# Patient Record
Sex: Female | Born: 1955 | Race: Black or African American | Hispanic: No | Marital: Single | Smoking: Never smoker
Health system: Southern US, Community
[De-identification: ages and names within clinical notes are randomized; demographics above are authoritative.]

## PROBLEM LIST (undated history)

## (undated) DIAGNOSIS — K5730 Diverticulosis of large intestine without perforation or abscess without bleeding: Secondary | ICD-10-CM

## (undated) DIAGNOSIS — D126 Benign neoplasm of colon, unspecified: Secondary | ICD-10-CM

## (undated) HISTORY — DX: Benign neoplasm of colon, unspecified: D12.6

## (undated) HISTORY — PX: APPENDECTOMY: SHX54

## (undated) HISTORY — DX: Diverticulosis of large intestine without perforation or abscess without bleeding: K57.30

---

## 2007-08-08 DIAGNOSIS — K5730 Diverticulosis of large intestine without perforation or abscess without bleeding: Secondary | ICD-10-CM

## 2007-08-08 HISTORY — DX: Diverticulosis of large intestine without perforation or abscess without bleeding: K57.30

## 2007-08-19 ENCOUNTER — Ambulatory Visit: Payer: Self-pay | Admitting: Gastroenterology

## 2007-08-28 ENCOUNTER — Encounter: Payer: Self-pay | Admitting: Gastroenterology

## 2007-08-28 ENCOUNTER — Ambulatory Visit (HOSPITAL_COMMUNITY): Admission: RE | Admit: 2007-08-28 | Discharge: 2007-08-28 | Payer: Self-pay | Admitting: Gastroenterology

## 2007-08-28 ENCOUNTER — Ambulatory Visit: Payer: Self-pay | Admitting: Gastroenterology

## 2007-08-28 DIAGNOSIS — D126 Benign neoplasm of colon, unspecified: Secondary | ICD-10-CM

## 2007-08-28 HISTORY — PX: COLONOSCOPY: SHX174

## 2007-08-28 HISTORY — DX: Benign neoplasm of colon, unspecified: D12.6

## 2010-05-08 ENCOUNTER — Emergency Department (HOSPITAL_COMMUNITY)
Admission: EM | Admit: 2010-05-08 | Discharge: 2010-05-08 | Disposition: A | Discharge status: Home / Self Care | Payer: Self-pay | Attending: Emergency Medicine | Admitting: Emergency Medicine

## 2010-05-08 DIAGNOSIS — L299 Pruritus, unspecified: Secondary | ICD-10-CM | POA: Insufficient documentation

## 2010-05-08 DIAGNOSIS — L255 Unspecified contact dermatitis due to plants, except food: Secondary | ICD-10-CM | POA: Insufficient documentation

## 2010-05-22 NOTE — Op Note (Signed)
NAMELILLER, Wanda            ACCOUNT NO.:  [id number]   MEDICAL RECORD NO.:  [id number]          PATIENT TYPE:  AMB   LOCATION:  DAY                           FACILITY:  [facility]   PHYSICIAN:  Kassie Mends, M.D.      DATE OF BIRTH:  05-22-1955   DATE OF PROCEDURE:  08/28/2007  DATE OF DISCHARGE:                               OPERATIVE REPORT   REFERRING PHYSICIAN:  [facility] Department.   PROCEDURE:  Colonoscopy with snare cautery polypectomy.   INDICATIONS FOR EXAM:  Ms. Wanda Heath is a 55 year old female who  presents with rectal bleeding, intermittent.   FINDINGS:  1. A 1.2-cm sessile ascending colon polyp removed via snare cautery.      A 6-mm flat sigmoid colon polyp removed via snare cautery.  2. Rare sigmoid colon diverticula.  3. Moderate internal hemorrhoids.  Otherwise, normal retroflexed view      of the rectum and no masses, inflammatory changes, or AVM seen.   DIAGNOSES:  1. Large ascending colon polyp.  2. Small sigmoid colon polyp.  3. Mild diverticulosis.  4. Moderate internal hemorrhoids.   RECOMMENDATIONS:  1. Screening colonoscopy in 5 years.  Her first-degree relatives      should have a screening colonoscopy at age 56 and then every 5      years.  2. We will call her with results of her biopsies.  3. She should follow a high-fiber diet.  She is given a handout on      high-fiber diet, polyps, and hemorrhoids.  4. No aspirin, NSAIDs, or anticoagulation for 7 days.   MEDICATIONS:  1. Demerol 100 mg IV.  2. Versed 5 mg IV.   PROCEDURE TECHNIQUE:  Physical exam was performed.  Informed consent was  obtained from the patient after explaining the benefits, risks, and  alternatives to the procedure.  The patient was connected to the monitor  and placed in left lateral position.  Continuous oxygen was provided by  nasal cannula and IV medicine administered through an indwelling  cannula.  After administration of sedation and rectal exam,  the  patient's rectum was intubated and scope advanced under direct  visualization to the cecum.  The patient has slightly tortuous sigmoid  colon.  The  scope was removed slowly by carefully examining the color, texture,  anatomy, and integrity of the mucosa on the way out.  The patient was  recovered in endoscopy and discharged home in satisfactory condition.   PATH:  Tubulovillous adenoma. TCS in 5 years.      Kassie Mends, M.D.  Electronically Signed     SM/MEDQ  D:  08/28/2007  T:  08/29/2007  Job:  [id number]   cc:   [facility] Department

## 2010-05-22 NOTE — Consult Note (Signed)
NAMEAMETHYST, Wanda Heath            ACCOUNT NO.:  [id number]   MEDICAL RECORD NO.:  [id number]          PATIENT TYPE:  AMB   LOCATION:  DAY                           FACILITY:  [facility]   PHYSICIAN:  Kassie Mends, M.D.      DATE OF BIRTH:  01/04/1956   DATE OF CONSULTATION:  08/19/2007  DATE OF DISCHARGE:                                 CONSULTATION   REASON FOR CONSULTATION:  Rectal bleeding.   HISTORY OF PRESENT ILLNESS:  Wanda Heath is a 55 year old female who  was diagnosed with hemorrhoids 19 years ago when she had her last child.  She has been seeing bleeding in her stool off and on for 4 months.  The  first time she used the bathroom and she saw blood in her stool for 1-2  days, and then it stopped.  Then a month ago, she had a bowel movement,  saw blood in her stool, and it lasted 3-4 days.  She did not see pain in  her rectum.  She does have occasional rectal itching.  She was given  rectal suppositories but that was after the bleeding already stopped.  She denies any weight loss or change in her bowel habits.  She  occasionally takes Maxitrate for constipation.  She did take Maxitrate  prior to the second episode of rectal bleeding.  She denies having to  strain to have a bowel movement.  Sometimes, she has hard stools,  sometimes it is normal.  She easily has a bowel movement every other day  or every day.  She denies any nausea or vomiting.  She has no problem  swallowing.  Some foods cause bloating.  She eats ice cream every night.  Her weight has increased from 163 pounds to 195 pounds since the last  year.   PAST MEDICAL HISTORY:  None.   PAST SURGICAL HISTORY:  Appendectomy.  She has never had any problems  with sedation.   ALLERGIES:  No known drug allergies.   MEDICATIONS:  None.   FAMILY HISTORY:  She denies any family history of breast, uterine,  ovarian or colon cancer.  She has no family history of polyp.   SOCIAL HISTORY:  She is single and she has 2  kids.  Kids are 35 and 19.  She is unemployed.  She has never smoked, and occasionally drinks  alcohol.   REVIEW OF SYSTEMS:  As per the HPI.  Otherwise, all systems are  negative.   PHYSICAL EXAMINATION:  VITAL SIGNS:  Weight 195 pounds, height 5 feet 2  inches, BMI 35.7 (obese), temperature 98.7, blood pressure 128/88, and  pulse 80.  GENERAL:  She is in no apparent distress.  Alert and oriented x4.  HEENT Exam:  Atraumatic and normocephalic.  Pupils are equal and  reactive to light.  Mouth, no oral lesions.  Posterior pharynx without  erythema or exudate.  NECK:  Has full range of motion.  No lymphadenopathy.  LUNGS:  Clear to auscultation bilaterally.  CARDIOVASCULAR:  Regular rate and rhythm.  No murmur.  Normal S1 and S2.  ABDOMEN:  Bowel sounds are  present soft.  Nontender and nondistended.  No rebound or guarding.  EXTREMITIES:  No cyanosis or edema.  NEURO:  She has no focal neurologic deficits.   ASSESSMENT:  Wanda Heath is a 54 year old female who presents with  painless rectal bleeding.  The differential diagnosis includes  hemorrhoid, colon polyp, and colon cancer.   Thank you for allowing me to see Wanda Heath in consultation.  My  recommendations follow.   RECOMMENDATIONS:  1. Screening colonoscopy next week.  2. She will be followed up according to the findings on the      colonoscopy.      Kassie Mends, M.D.  Electronically Signed     SM/MEDQ  D:  08/19/2007  T:  08/20/2007  Job:  [id number]

## 2010-10-08 ENCOUNTER — Ambulatory Visit: Payer: Self-pay | Admitting: Gastroenterology

## 2010-10-19 ENCOUNTER — Ambulatory Visit (INDEPENDENT_AMBULATORY_CARE_PROVIDER_SITE_OTHER): Payer: Self-pay | Admitting: Urgent Care

## 2010-10-19 ENCOUNTER — Encounter: Payer: Self-pay | Admitting: Urgent Care

## 2010-10-19 DIAGNOSIS — R109 Unspecified abdominal pain: Secondary | ICD-10-CM | POA: Insufficient documentation

## 2010-10-19 DIAGNOSIS — K921 Melena: Secondary | ICD-10-CM | POA: Insufficient documentation

## 2010-10-19 MED ORDER — OMEPRAZOLE 20 MG PO CPDR: 20.0000 mg | DELAYED_RELEASE_CAPSULE | Freq: Every day | ORAL | Status: AC

## 2010-10-19 MED ORDER — HYDROCORTISONE ACETATE 25 MG RE SUPP: 25.0000 mg | Freq: Two times a day (BID) | RECTAL | Status: DC

## 2010-10-19 NOTE — Assessment & Plan Note (Signed)
Possibly due to hemorrhoids, although cannot r/o evolving inflammatory bowel disease.  Last colonoscopy just over 3 yrs ago.  May need to repeat exam.  Will decide pending CT results.

## 2010-10-19 NOTE — Progress Notes (Signed)
Primary Care Physician:  No primary provider on file. Primary Gastroenterologist:  Dr. Darrick Penna  Chief Complaint  Patient presents with  . Abdominal Pain    everything hurt stomach  . Rectal Bleeding    not now    HPI:  Wanda Heath is a 55 y.o. female here.  She was last evaluated by Dr. Darrick Penna back in August of 2009 for rectal bleeding. She had a colonoscopy at that time which showed a tubulovillous adenoma which was removed from her colon, another benign polyp, diverticulosis, and hemorrhoids.  She presents today with recurrent rectal bleeding that is not necessarily associated w/ BM.  C/o abdominal pain x 6 mo.  Getting worse.  "Feels raw on inside".  Pain 10/10 at worst.  Pain worse w/ eating or drinking.  Awakened at night with severe abdominal aching several nights per week.  Cannot sleep.  Lots of belching.  Hunger pangs throughout day even after eating.  Feels like flu.  Complaining of muscle, joint and body aches. Denies heartburn or indigestion, N/V, dysphagia or odynophagia.  Denies fevers or chills.  Hx hemorrhoids.  Appetite ok.  Wt stable.  Feels bloated.  Denies constipation or diarrhea.    Past Medical History  Diagnosis Date  . 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 Diverticulosis of colon 8/09  . Hemorrhoids 8/09    Past Surgical History  Procedure Date  . Appendectomy     Current Outpatient Prescriptions  Medication Sig Dispense Refill  . hydrocortisone (ANUSOL-HC) 25 MG suppository Place 1 suppository (25 mg total) rectally every 12 (twelve) hours.  20 suppository  0  . omeprazole (PRILOSEC) 20 MG capsule Take 1 capsule (20 mg total) by mouth daily.  31 capsule  2    Allergies as of 10/19/2010  . (No Known Allergies)    Family History:There is no known family history of colorectal carcinoma , liver disease, or inflammatory bowel disease.  Problem Relation Age of Onset  . Coronary artery disease Father     History   Social  History  . Marital Status: Single    Spouse Name: N/A    Number of Children: 2  . Years of Education: N/A   Occupational History  . unemployed, home health care    Social History Main Topics  . Smoking status: Never Smoker   . Smokeless tobacco: Not on file  . Alcohol Use: No  . Drug Use: No  . Sexually Active: Not on file   Other Topics Concern  . Not on file   Social History Narrative   Lives w/ daughter   Review of Systems: Gen: see hpi CV: Denies chest pain, angina, palpitations, syncope, orthopnea, PND, peripheral edema, and claudication. Resp: Denies dyspnea at rest, dyspnea with exercise, cough, sputum, wheezing, coughing up blood, and pleurisy. GI: Denies vomiting blood, jaundice, and fecal incontinence.   Denies dysphagia or odynophagia. GU : Denies urinary burning, blood in urine, urinary frequency, urinary hesitancy, nocturnal urination, and urinary incontinence. MS: Denies joint pain, limitation of movement, and swelling, stiffness, low back pain, extremity pain. Denies muscle weakness, cramps, atrophy.  Derm: Denies rash, itching, dry skin, hives, moles, warts, or unhealing ulcers.  Psych: Denies depression, anxiety, memory loss, suicidal ideation, hallucinations, paranoia, and confusion. Heme: Denies bruising and enlarged lymph nodes.  Physical Exam: BP 128/87  Pulse 88  Temp(Src) 97 F (36.1 C) (Temporal)  Ht 5\' 2"  (1.575 m)  Wt 199 lb 9.6 oz (90.538  kg)  BMI 36.51 kg/m2 General:   Alert,  Well-developed, obese, pleasant and cooperative in NAD Head:  Normocephalic and atraumatic. Eyes:  Sclera clear, no icterus.   Conjunctiva pink. Ears:  Normal auditory acuity. Nose:  No deformity, discharge,  or lesions. Mouth:  No deformity or lesions, oropharynx pink and moist. Neck:  Supple; no masses or thyromegaly. Lungs:  Clear throughout to auscultation.   No wheezes, crackles, or rhonchi. No acute distress. Heart:  Regular rate and rhythm; no murmurs, clicks,  rubs,  or gallops. Abdomen:  Soft, obese, and nondistended. Moderate tenderness to epigastrium and around the umbilicus as well as bilateral upper quadrant on deep palpation. Negative Murphy sign. No masses, hepatosplenomegaly or hernias noted. Normal bowel sounds, without guarding, and without rebound.   Rectal:  Deferred until time of colonoscopy.   Msk:  Symmetrical without gross deformities. Normal posture. Pulses:  Normal pulses noted. Extremities:  Without clubbing or edema. Neurologic:  Alert and  oriented x4;  grossly normal neurologically. Skin:  Intact without significant lesions or rashes. Cervical Nodes:  No significant cervical adenopathy. Psych:  Alert and cooperative. Normal mood and affect.

## 2010-10-19 NOTE — Assessment & Plan Note (Addendum)
Wanda Heath is a 55 y.o. female  who presents with a six-month history of severe abdominal pain. Pain is made worse by eating or drinking. She is also being awakened by pain while sleeping in the middle of the night.  Intermittent hematochezia.  Differentials are wide and include inflammatory bowel disease, pancreatitis, diverticulitis, gallbladder disease, PUD & GERD.  She will need CT abd/pelvis w/ IV/oral contrast to further evaluate.    CBC, CMP, amylase, lipase CT abd/pelvis w/ IV/oral CM GO directly to ER if you have severe pain, nausea, vomiting, bleeding, dizziness, etc Begin omeprazole 20mg  daily Use anusol suppositories as directed

## 2010-10-19 NOTE — Patient Instructions (Signed)
Please go get your labs today You will have a CT scan of your abdomen & pelvis.  I will call with your results. If you do not hear from me within 3 days of your CT scan, please call our office. GO directly to ER if you have severe pain, nausea, vomiting, bleeding, dizziness, etc Begin omeprazole 20mg  daily Use anusol suppositories as directed    Abdominal Pain Abdominal pain can be caused by many things. Your caregiver decides the seriousness of your pain by an examination and possibly blood tests and X-rays. Many cases can be observed and treated at home. Most abdominal pain is not caused by a disease and will probably improve without treatment. However, in many cases, more time must pass before a clear cause of the pain can be found. Before that point, it may not be known if you need more testing, or if hospitalization or surgery is needed. HOME CARE INSTRUCTIONS  Do not take laxatives unless directed by your caregiver.   Take pain medicine only as directed by your caregiver.   Only take over-the-counter or prescription medicines for pain, discomfort, or fever as directed by your caregiver.   Try a clear liquid diet (broth, tea, or water) for 2 days or as ordered by your caregiver. Slowly move to a bland diet as tolerated.  SEEK IMMEDIATE MEDICAL CARE IF:  The pain does not go away.   You or your child has an oral temperature above 101, not controlled by medicine.   You keep throwing up (vomiting).   The pain is felt only in portions of the abdomen. Pain in the right side could possibly be appendicitis. In an adult, pain in the left lower portion of the abdomen could be colitis or diverticulitis.   You pass bloody or black tarry stools.  MAKE SURE YOU:  Understand these instructions.   Will watch your condition.   Will get help right away if you are not doing well or get worse.  Document Released: 10/03/2004 Document Re-Released: 03/20/2009 [facility] Patient Information 2011  [place], [place].

## 2010-10-20 LAB — COMPREHENSIVE METABOLIC PANEL
AST: 19 U/L (ref 0–37)
Albumin: 4.4 g/dL (ref 3.5–5.2)
Alkaline Phosphatase: 114 U/L (ref 39–117)
Potassium: 4.1 mEq/L (ref 3.5–5.3)
Sodium: 138 mEq/L (ref 135–145)
Total Protein: 7.5 g/dL (ref 6.0–8.3)

## 2010-10-20 LAB — CBC WITH DIFFERENTIAL/PLATELET
Basophils Absolute: 0 10*3/uL (ref 0.0–0.1)
Basophils Relative: 0 % (ref 0–1)
Hemoglobin: 12.8 g/dL (ref 12.0–15.0)
MCHC: 32.9 g/dL (ref 30.0–36.0)
Monocytes Relative: 6 % (ref 3–12)
Neutro Abs: 5.1 10*3/uL (ref 1.7–7.7)
Neutrophils Relative %: 57 % (ref 43–77)
RDW: 13.8 % (ref 11.5–15.5)
WBC: 9 10*3/uL (ref 4.0–10.5)

## 2010-10-20 LAB — LIPASE: Lipase: 24 U/L (ref 0–75)

## 2010-10-20 LAB — AMYLASE: Amylase: 62 U/L (ref 0–105)

## 2010-10-22 NOTE — Progress Notes (Signed)
Quick Note:  Labs ok. Will await 10/19 CT.  ______

## 2010-10-24 NOTE — Progress Notes (Signed)
No PCP on file 

## 2010-10-26 ENCOUNTER — Ambulatory Visit (HOSPITAL_COMMUNITY)
Admission: RE | Admit: 2010-10-26 | Discharge: 2010-10-26 | Disposition: A | Discharge status: Home / Self Care | Payer: Self-pay | Source: Ambulatory Visit | Attending: Urgent Care | Admitting: Urgent Care

## 2010-10-26 DIAGNOSIS — R109 Unspecified abdominal pain: Secondary | ICD-10-CM | POA: Insufficient documentation

## 2010-10-26 DIAGNOSIS — R932 Abnormal findings on diagnostic imaging of liver and biliary tract: Secondary | ICD-10-CM | POA: Insufficient documentation

## 2010-10-26 MED ORDER — IOHEXOL 300 MG/ML  SOLN
100.0000 mL | Freq: Once | INTRAMUSCULAR | Status: AC | PRN
  Administered 2010-10-26: 100 mL via INTRAVENOUS

## 2010-10-26 NOTE — Progress Notes (Signed)
Quick Note:  Please call pt. She has fatty liver on CT & possible sludge or polyp in gallbladder. Needs HIDA scan to check function of gallbladder. Instructions for fatty liver: Recommend 1-2# weight loss per week until ideal body weight through exercise & diet. Low fat/cholesterol diet. Gradually increase exercise from 15 min daily up to 1 hr per day 5 days/week. Limit alcohol use.   ______

## 2010-10-29 ENCOUNTER — Telehealth: Payer: Self-pay | Admitting: Gastroenterology

## 2010-10-29 NOTE — Progress Notes (Signed)
LM w/ [facility] to schedule

## 2010-10-29 NOTE — Telephone Encounter (Signed)
Called, many rings and no answer.

## 2010-10-29 NOTE — Telephone Encounter (Signed)
Patient wants her lab results and CT results

## 2010-10-29 NOTE — Progress Notes (Signed)
Quick Note:  Pt informed. She can be reached at [phone] and OK to leave message. ______

## 2010-10-29 NOTE — Progress Notes (Signed)
Quick Note:  Called, many rings and no answer. ______ 

## 2010-10-30 ENCOUNTER — Other Ambulatory Visit: Payer: Self-pay | Admitting: Gastroenterology

## 2010-10-30 DIAGNOSIS — R109 Unspecified abdominal pain: Secondary | ICD-10-CM

## 2010-10-30 NOTE — Progress Notes (Signed)
Pt scheduled for 10/25/@9 :45- LMOVM and at home #

## 2010-10-31 NOTE — Telephone Encounter (Signed)
Pt was informed on 10/29/2010 @ 3:23 PM. (See under CT report)

## 2010-11-01 ENCOUNTER — Other Ambulatory Visit (HOSPITAL_COMMUNITY): Payer: Self-pay

## 2010-11-02 ENCOUNTER — Encounter (HOSPITAL_COMMUNITY)
Admission: RE | Admit: 2010-11-02 | Discharge: 2010-11-02 | Disposition: A | Discharge status: Home / Self Care | Payer: Self-pay | Source: Ambulatory Visit | Attending: Gastroenterology | Admitting: Gastroenterology

## 2010-11-02 DIAGNOSIS — R109 Unspecified abdominal pain: Secondary | ICD-10-CM | POA: Insufficient documentation

## 2010-11-02 MED ORDER — SINCALIDE 5 MCG IJ SOLR
INTRAMUSCULAR | Status: AC
  Administered 2010-11-02: 1.79 ug
  Filled 2010-11-02: qty 5

## 2010-11-02 MED ORDER — SINCALIDE 5 MCG IJ SOLR: 0.0200 ug/kg | Freq: Once | INTRAMUSCULAR | Status: DC

## 2010-11-02 MED ORDER — TECHNETIUM TC 99M MEBROFENIN IV KIT
5.0000 | PACK | Freq: Once | INTRAVENOUS | Status: AC | PRN
  Administered 2010-11-02: 4.8 via INTRAVENOUS

## 2010-11-05 NOTE — Progress Notes (Signed)
Quick Note:  Please call pt. Her GB function (HIDA Scan) is normal. Needs OV w/ SLF next available if still w/ pain & bleeding. Thanks ______

## 2010-11-05 NOTE — Telephone Encounter (Signed)
ADDRESSED BY Doristine Church

## 2010-11-06 ENCOUNTER — Telehealth: Payer: Self-pay | Admitting: Gastroenterology

## 2010-11-06 NOTE — Progress Notes (Signed)
Quick Note:  Called pt at [phone] and informed of results. She is still having the abdominal pain but no bleeding. She is aware Darl Pikes will call for appt with Dr. Darrick Penna. ______

## 2010-11-06 NOTE — Progress Notes (Signed)
Quick Note:  Called, many rings and no answer. ______ 

## 2010-11-06 NOTE — Telephone Encounter (Signed)
Pt called to see if her results were back from Friday. She can be reached at [phone]

## 2010-11-07 NOTE — Telephone Encounter (Signed)
Pt was informed on 11/06/2010. See under imaging.

## 2010-11-16 ENCOUNTER — Encounter: Payer: Self-pay | Admitting: Gastroenterology

## 2010-11-19 ENCOUNTER — Telehealth: Payer: Self-pay | Admitting: General Practice

## 2010-11-19 ENCOUNTER — Ambulatory Visit (INDEPENDENT_AMBULATORY_CARE_PROVIDER_SITE_OTHER): Payer: Self-pay | Admitting: Gastroenterology

## 2010-11-19 ENCOUNTER — Encounter: Payer: Self-pay | Admitting: Gastroenterology

## 2010-11-19 DIAGNOSIS — R1013 Epigastric pain: Secondary | ICD-10-CM | POA: Insufficient documentation

## 2010-11-19 DIAGNOSIS — K3189 Other diseases of stomach and duodenum: Secondary | ICD-10-CM

## 2010-11-19 DIAGNOSIS — R109 Unspecified abdominal pain: Secondary | ICD-10-CM

## 2010-11-19 DIAGNOSIS — K625 Hemorrhage of anus and rectum: Secondary | ICD-10-CM

## 2010-11-19 MED ORDER — HYDROCORTISONE ACETATE 25 MG RE SUPP: 25.0000 mg | Freq: Two times a day (BID) | RECTAL | Status: DC

## 2010-11-19 NOTE — Patient Instructions (Signed)
Continue taking Prilosec daily, 30 minutes before breakfast.  Start taking Anusol suppositories, twice a day per rectum, for 10 days. This is to help treat hemorrhoids.  We have set you up for a look at your upper and lower GI tract (endoscopy and colonoscopy) with Dr. Darrick Penna. Further recommendations to follow once this is completed.

## 2010-11-19 NOTE — Assessment & Plan Note (Signed)
See abdominal pain. Continue Prilosec as directed. May need change in PPI.

## 2010-11-19 NOTE — Assessment & Plan Note (Addendum)
55 year old female with 6 month history of epigastric pain, worsened by eating/drinking. Wakes up with pain. Intermittent, described as achy. Associated with nausea. Noted early satiety, bloating, belching, denies use of NSAIDs or aspirin powders. On Omeprazole. No prior EGD per pt. Needs EGD due to new onset dyspepsia at time of colonoscopy for rectal bleeding.  As of note, CT and labs negative as outlined above.   Proceed with upper endoscopy in the near future with Dr. Darrick Penna. The risks, benefits, and alternatives have been discussed in detail with patient. They have stated understanding and desire to proceed.

## 2010-11-19 NOTE — Progress Notes (Signed)
Referring Provider: No ref. provider found Primary Care Physician:  Tylene Fantasia., PA, PA-C Primary Gastroenterologist: Dr. Darrick Penna   Chief Complaint  Patient presents with  . Abdominal Pain  . Rectal Bleeding    HPI:   Wanda Heath returns today in follow-up for abdominal pain and rectal bleeding. She was seen October 19, 2010, by our office with symptoms of worsening abdominal pain X 6 months and recurrent rectal bleeding. Last colonoscopy outlined below in [facility], 2009. Labs were completed as well as CT and HIDA.  States rectal bleeding intermittently. Last weekend had episode. States like "urinating" from rectum, stool encased with bright red blood. BM every day. Reports epigastric pain, states eating/drinking "irritates" it. Waking up with epigastric pain. Intermittent. Described as "achy". Intermittent nausea. No melena. Complains of growling. Complains of early satiety, bloating. Symptoms X 4-5 months. Belching a lot.  Denies NSAIDs or aspirin powders.     CT IMPRESSION Oct 2012:  1. Possible adenomyomatosis involving the gallbladder or  gallbladder sludge. The right upper quadrant ultrasound may be  helpful for further evaluation. No CT findings to suggest acute  cholecystitis.  2. The remainder of the abdomen and pelvis is unremarkable.  HIDA: normal EF of 94%.   Amylase, Lipase, CBC, LFTs, normal.    Past Medical History  Diagnosis Date  . 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 Diverticulosis of colon 8/09  . Hemorrhoids 8/09    Past Surgical History  Procedure Date  . Appendectomy   . Colonoscopy 08/28/07    1.2 cm sessile ascending colon polyp,removed , 6mm flat sigmoid colon polyp removed, rare sigmoid diverticula, moderate internal hemorrhoids     Current Outpatient Prescriptions  Medication Sig Dispense Refill  . omeprazole (PRILOSEC) 20 MG capsule Take 1 capsule (20 mg total) by mouth daily.  31 capsule  2  . hydrocortisone  (ANUSOL-HC) 25 MG suppository Place 1 suppository (25 mg total) rectally every 12 (twelve) hours.  20 suppository  0    Allergies as of 11/19/2010  . (No Known Allergies)    Family History  Problem Relation Age of Onset  . Coronary artery disease Father     History   Social History  . Marital Status: Single    Spouse Name: N/A    Number of Children: 2  . Years of Education: N/A   Occupational History  . unemployed, home health care    Social History Main Topics  . Smoking status: Never Smoker   . Smokeless tobacco: None  . Alcohol Use: No  . Drug Use: No  . Sexually Active: None   Other Topics Concern  . None   Social History Narrative   Lives w/ daughter    Review of Systems: Gen: Denies fever, chills, anorexia. Denies fatigue, weakness, weight loss.  CV: Denies chest pain, palpitations, syncope, peripheral edema, and claudication. Resp: Denies dyspnea at rest, cough, wheezing, coughing up blood, and pleurisy. GI: SEE HPI Derm: Denies rash, itching, dry skin Psych: Denies depression, anxiety, memory loss, confusion. No homicidal or suicidal ideation.  Heme: Denies bruising, bleeding, and enlarged lymph nodes.  Physical Exam: Pulse 85  Temp(Src) 98.6 F (37 C) (Temporal)  Ht 5\' 2"  (1.575 m)  Wt 205 lb 6.4 oz (93.169 kg)  BMI 37.57 kg/m2 General:   Alert and oriented. No distress noted. Pleasant and cooperative.  Head:  Normocephalic and atraumatic. Eyes:  Conjuctiva clear without scleral icterus. Mouth:  Oral mucosa pink and moist.  Good dentition. No lesions. Neck:  Supple, without mass or thyromegaly. Heart:  S1, S2 present without murmurs, rubs, or gallops. Regular rate and rhythm. Abdomen:  +BS, soft, mildly tender to palpation epigastric region,  non-distended. Obese. No rebound or guarding. No HSM or masses noted. Msk:  Symmetrical without gross deformities. Normal posture. Extremities:  Without edema. Neurologic:  Alert and  oriented x4;  grossly  normal neurologically. Skin:  Intact without significant lesions or rashes. Cervical Nodes:  No significant cervical adenopathy. Psych:  Alert and cooperative. Normal mood and affect.

## 2010-11-19 NOTE — Telephone Encounter (Signed)
I spoke with the pt and gave her the new arrival time.

## 2010-11-19 NOTE — Assessment & Plan Note (Addendum)
Last colonoscopy 3 years ago, hx of tubulovillous adenoma. Rectal bleeding intermittently, encasing stools, states "like urinating" from rectum. No diarrhea or constipation. Did not use Anusol suppositories as directed last visit. Likely benign anorectal source, internal hemorrhoids, but we will reevaluate with updated TCS.  Proceed with colonoscopy with Dr. Darrick Penna in the near future. The risks, benefits, and alternatives have been discussed in detail with the patient. They state understanding and desire to proceed.  Anusol supp BID X 10 days, rx sent to pharmacy and pt informed

## 2010-11-19 NOTE — Progress Notes (Signed)
Cc to PCP 

## 2010-11-19 NOTE — Telephone Encounter (Signed)
I called and lm with a family member for the pt to give me a call. [id number] time for 12/07/10 has been changed from 10:30am to 11:00am

## 2010-11-23 ENCOUNTER — Encounter (HOSPITAL_COMMUNITY): Payer: Self-pay | Admitting: Pharmacy Technician

## 2010-11-27 NOTE — Progress Notes (Signed)
REVIEWED. AGREE.  Pt presented 2009 w/ brbpr-195 lbs-TV ADENOMA IN AC, MOD IH. GAINED 10 LBS. Most likely 2o to [facility]. ALSO C/O NEW-[place]SET DYSPEPSIA. OCT 2012: CT A/P: ?GB SLUDGE, HIDA NL

## 2010-11-30 ENCOUNTER — Encounter (HOSPITAL_COMMUNITY): Payer: Self-pay | Admitting: Pharmacy Technician

## 2010-12-06 MED ORDER — SODIUM CHLORIDE 0.45 % IV SOLN
Freq: Once | INTRAVENOUS | Status: AC
Start: 2010-12-06 — End: 2010-12-07
  Administered 2010-12-07: 11:00:00 via INTRAVENOUS

## 2010-12-07 ENCOUNTER — Ambulatory Visit (HOSPITAL_COMMUNITY)
Admission: RE | Admit: 2010-12-07 | Discharge: 2010-12-07 | Disposition: A | Discharge status: Home / Self Care | Payer: Self-pay | Source: Ambulatory Visit | Attending: Gastroenterology | Admitting: Gastroenterology

## 2010-12-07 ENCOUNTER — Encounter (HOSPITAL_COMMUNITY): Payer: Self-pay

## 2010-12-07 ENCOUNTER — Encounter (HOSPITAL_COMMUNITY)
Admission: RE | Disposition: A | Discharge status: Home / Self Care | Payer: Self-pay | Source: Ambulatory Visit | Attending: Gastroenterology

## 2010-12-07 ENCOUNTER — Other Ambulatory Visit: Payer: Self-pay | Admitting: Gastroenterology

## 2010-12-07 DIAGNOSIS — R635 Abnormal weight gain: Secondary | ICD-10-CM | POA: Insufficient documentation

## 2010-12-07 DIAGNOSIS — K625 Hemorrhage of anus and rectum: Secondary | ICD-10-CM

## 2010-12-07 DIAGNOSIS — K2940 Chronic atrophic gastritis without bleeding: Secondary | ICD-10-CM | POA: Insufficient documentation

## 2010-12-07 DIAGNOSIS — R109 Unspecified abdominal pain: Secondary | ICD-10-CM

## 2010-12-07 DIAGNOSIS — D126 Benign neoplasm of colon, unspecified: Secondary | ICD-10-CM | POA: Insufficient documentation

## 2010-12-07 DIAGNOSIS — K921 Melena: Secondary | ICD-10-CM | POA: Insufficient documentation

## 2010-12-07 DIAGNOSIS — A048 Other specified bacterial intestinal infections: Secondary | ICD-10-CM | POA: Insufficient documentation

## 2010-12-07 DIAGNOSIS — K648 Other hemorrhoids: Secondary | ICD-10-CM | POA: Insufficient documentation

## 2010-12-07 DIAGNOSIS — R1013 Epigastric pain: Secondary | ICD-10-CM | POA: Insufficient documentation

## 2010-12-07 DIAGNOSIS — K2970 Gastritis, unspecified, without bleeding: Secondary | ICD-10-CM

## 2010-12-07 DIAGNOSIS — K2990 Gastroduodenitis, unspecified, without bleeding: Secondary | ICD-10-CM

## 2010-12-07 HISTORY — PX: ESOPHAGOGASTRODUODENOSCOPY: SHX1529

## 2010-12-07 HISTORY — PX: COLONOSCOPY: SHX174

## 2010-12-07 SURGERY — COLONOSCOPY WITH ESOPHAGOGASTRODUODENOSCOPY (EGD)
Anesthesia: Moderate Sedation

## 2010-12-07 MED ORDER — BUTAMBEN-TETRACAINE-BENZOCAINE 2-2-14 % EX AERO
INHALATION_SPRAY | CUTANEOUS | Status: DC | PRN
  Administered 2010-12-07: 2 via TOPICAL

## 2010-12-07 MED ORDER — STERILE WATER FOR IRRIGATION IR SOLN
Status: DC | PRN
  Administered 2010-12-07: 11:00:00

## 2010-12-07 MED ORDER — MEPERIDINE HCL 100 MG/ML IJ SOLN
INTRAMUSCULAR | Status: AC
  Filled 2010-12-07: qty 2

## 2010-12-07 MED ORDER — MIDAZOLAM HCL 5 MG/5ML IJ SOLN
INTRAMUSCULAR | Status: AC
  Filled 2010-12-07: qty 10

## 2010-12-07 MED ORDER — MEPERIDINE HCL 100 MG/ML IJ SOLN
INTRAMUSCULAR | Status: DC | PRN
  Administered 2010-12-07 (×2): 50 mg via INTRAVENOUS
  Administered 2010-12-07: 25 mg via INTRAVENOUS

## 2010-12-07 MED ORDER — MIDAZOLAM HCL 5 MG/5ML IJ SOLN
INTRAMUSCULAR | Status: DC | PRN
  Administered 2010-12-07: 1 mg via INTRAVENOUS
  Administered 2010-12-07: 2 mg via INTRAVENOUS
  Administered 2010-12-07 (×2): 1 mg via INTRAVENOUS
  Administered 2010-12-07: 2 mg via INTRAVENOUS

## 2010-12-07 NOTE — Interval H&P Note (Signed)
History and Physical Interval Note:  12/07/2010 11:38 AM  Wanda Heath  has presented today for surgery, with the diagnosis of rectal bleeding,dyspepsia  The various methods of treatment have been discussed with the patient and family. After consideration of risks, benefits and other options for treatment, the patient has consented to  Procedure(s): COLONOSCOPY WITH ESOPHAGOGASTRODUODENOSCOPY (EGD) as a surgical intervention .  The patients' history has been reviewed, patient examined, no change in status, stable for surgery.  I have reviewed the patients' chart and labs.  Questions were answered to the patient's satisfaction.     [employer]

## 2010-12-07 NOTE — H&P (Signed)
Pulse Temp(Src) Ht Wt BMI    85  98.6 F (37 C) (Temporal)  5\' 2"  (1.575 m)  205 lb 6.4 oz (93.169 kg)  37.57 kg/m2       Progress Notes     Wanda Halls, NP  11/19/2010  1:31 PM  Signed Referring Provider: No ref. provider found Primary Care Physician:  Tylene Fantasia., PA, PA-C Primary Gastroenterologist: Dr. Darrick Penna     Chief Complaint   Patient presents with   .  Abdominal Pain   .  Rectal Bleeding      HPI:    Wanda Heath returns today in follow-up for abdominal pain and rectal bleeding. She was seen October 19, 2010, by our office with symptoms of worsening abdominal pain X 6 months and recurrent rectal bleeding. Last colonoscopy outlined below in [facility], 2009. Labs were completed as well as CT and HIDA.   States rectal bleeding intermittently. Last weekend had episode. States like "urinating" from rectum, stool encased with bright red blood. BM every day. Reports epigastric pain, states eating/drinking "irritates" it. Waking up with epigastric pain. Intermittent. Described as "achy". Intermittent nausea. No melena. Complains of growling. Complains of early satiety, bloating. Symptoms X 4-5 months. Belching a lot.  Denies NSAIDs or aspirin powders.        CT IMPRESSION Oct 2012:   1. Possible adenomyomatosis involving the gallbladder or   gallbladder sludge. The right upper quadrant ultrasound may be   helpful for further evaluation. No CT findings to suggest acute   cholecystitis.   2. The remainder of the abdomen and pelvis is unremarkable.   HIDA: normal EF of 94%.    Amylase, Lipase, CBC, LFTs, normal.       Past Medical History   Diagnosis  Date   .  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  Diverticulosis of colon  8/09   .  Hemorrhoids  8/09       Past Surgical History   Procedure  Date   .  Appendectomy     .  Colonoscopy  08/28/07       1.2 cm sessile ascending colon polyp,removed , 6mm flat sigmoid colon  polyp removed, rare sigmoid diverticula, moderate internal hemorrhoids        Current Outpatient Prescriptions   Medication  Sig  Dispense  Refill   .  omeprazole (PRILOSEC) 20 MG capsule  Take 1 capsule (20 mg total) by mouth daily.   31 capsule   2   .  hydrocortisone (ANUSOL-HC) 25 MG suppository  Place 1 suppository (25 mg total) rectally every 12 (twelve) hours.   20 suppository   0       Allergies as of 11/19/2010   .  (No Known Allergies)       Family History   Problem  Relation  Age of Onset   .  Coronary artery disease  Father         History       Social History   .  Marital Status:  Single       Spouse Name:  N/A       Number of Children:  2   .  Years of Education:  N/A       Occupational History   .  unemployed, home health care         Social History Main Topics   .  Smoking status:  Never Smoker    .  Smokeless tobacco:  None   .  Alcohol Use:  No   .  Drug Use:  No   .  Sexually Active:  None       Other Topics  Concern   .  None       Social History Narrative     Lives w/ daughter      Review of Systems: Gen: Denies fever, chills, anorexia. Denies fatigue, weakness, weight loss.   CV: Denies chest pain, palpitations, syncope, peripheral edema, and claudication. Resp: Denies dyspnea at rest, cough, wheezing, coughing up blood, and pleurisy. GI: SEE HPI Derm: Denies rash, itching, dry skin Psych: Denies depression, anxiety, memory loss, confusion. No homicidal or suicidal ideation.   Heme: Denies bruising, bleeding, and enlarged lymph nodes.   Physical Exam: Pulse 85  Temp(Src) 98.6 F (37 C) (Temporal)  Ht 5\' 2"  (1.575 m)  Wt 205 lb 6.4 oz (93.169 kg)  BMI 37.57 kg/m2 General:   Alert and oriented. No distress noted. Pleasant and cooperative.   Head:  Normocephalic and atraumatic. Eyes:  Conjuctiva clear without scleral icterus. Mouth:  Oral mucosa pink and moist. Good dentition. No lesions. Neck:  Supple, without mass or  thyromegaly. Heart:  S1, S2 present without murmurs, rubs, or gallops. Regular rate and rhythm. Abdomen:  +BS, soft, mildly tender to palpation epigastric region,  non-distended. Obese. No rebound or guarding. No HSM or masses noted. Msk:  Symmetrical without gross deformities. Normal posture. Extremities:  Without edema. Neurologic:  Alert and  oriented x4;  grossly normal neurologically. Skin:  Intact without significant lesions or rashes. Cervical Nodes:  No significant cervical adenopathy. Psych:  Alert and cooperative. Normal mood and affect.     Glendora Score  11/19/2010  2:52 PM  Signed Cc to PCP  Jonette Eva, MD  11/27/2010  6:16 PM  Signed REVIEWED. AGREE.   Pt presented 2009 w/ brbpr-195 lbs-TV ADENOMA IN AC, MOD IH. GAINED 10 LBS. Most likely 2o to [facility]. ALSO C/O [place] DYSPEPSIA. OCT 2012: CT A/P: ?GB SLUDGE, HIDA NL        Abdominal pain - Wanda Halls, NP  11/19/2010  1:28 PM  Addendum 55 year old female with 6 month history of epigastric pain, worsened by eating/drinking. Wakes up with pain. Intermittent, described as achy. Associated with nausea. Noted early satiety, bloating, belching, denies use of NSAIDs or aspirin powders. On Omeprazole. No prior EGD per pt. Needs EGD due to new onset dyspepsia at time of colonoscopy for rectal bleeding.   As of note, CT and labs negative as outlined above.    Proceed with upper endoscopy in the near future with Dr. Darrick Penna. The risks, benefits, and alternatives have been discussed in detail with patient. They have stated understanding and desire to proceed.        Previous Version  Rectal bleeding - Wanda Halls, NP  11/19/2010  1:30 PM  Addendum Last colonoscopy 3 years ago, hx of tubulovillous adenoma. Rectal bleeding intermittently, encasing stools, states "like urinating" from rectum. No diarrhea or constipation. Did not use Anusol suppositories as directed last visit. Likely benign anorectal source, internal hemorrhoids, but we  will reevaluate with updated TCS.   Proceed with colonoscopy with Dr. Darrick Penna in the near future. The risks, benefits, and alternatives have been discussed in detail with the patient. They state understanding and desire to proceed.   Anusol supp BID X 10 days, rx sent to pharmacy and  pt informed  Previous Version  Dyspepsia - Wanda Halls, NP  11/19/2010  1:30 PM  Signed See abdominal pain. Continue Prilosec as directed. May need change in PPI.

## 2010-12-10 ENCOUNTER — Encounter: Payer: Self-pay | Admitting: Gastroenterology

## 2010-12-13 ENCOUNTER — Telehealth: Payer: Self-pay | Admitting: Gastroenterology

## 2010-12-13 NOTE — Telephone Encounter (Signed)
Forwarding to Dr.Fields.  

## 2010-12-13 NOTE — Telephone Encounter (Signed)
Pt is calling to get her results. Please call her at [phone]

## 2010-12-14 NOTE — Telephone Encounter (Signed)
Called in Rx but I could not get in touch with the pt. I will try again before I leave today.

## 2010-12-14 NOTE — Telephone Encounter (Signed)
Please call pt. She had A HYPERPLASTIC POLYPS removed from her colon. Her stomach Bx showed H. Pylori infection. She needs AMOXICILLIN 500 mg 2 po BID for 10 days and Biaxin 500 mg po bid for 10 days, #qs, rfx0. She needs TO TAKE Omeprazole 20 mg TWICE DAILY for 10 days then 1 po dialy Med side effects include NVD, abd pain, and metallic taste. FOLLOW A HIGH FIBER DIET. TCS in 10 years. High fiber diet. OPV IN LATE FEB 2013.

## 2010-12-14 NOTE — Telephone Encounter (Signed)
Pt called back and is aware of results. She will call back in February to make a follow up appointment.I told her if she needed anything to please call us

## 2010-12-14 NOTE — Telephone Encounter (Signed)
REVIEWED.  

## 2010-12-17 ENCOUNTER — Telehealth: Payer: Self-pay

## 2010-12-17 NOTE — Telephone Encounter (Signed)
[facility] [place]/ VM ...the patient could not afford the Biaxin. Please advise!

## 2010-12-17 NOTE — Telephone Encounter (Signed)
VM left  On Friday 12/14/2010 @ 4:25 pm.

## 2010-12-20 NOTE — Telephone Encounter (Signed)
Reminder in epic to follow up in late Feb 2013 and repeat tcs in 10 years. Pt is aware of OV on 04/15/11 @ 3 with KJ per [facility]

## 2010-12-20 NOTE — Telephone Encounter (Signed)
Please call pt. She needs FLAGYL 500 MG BID & AMOXICILLIN 500 MG BID FOR 10 DAYS #QS, RFX0. OMP BID. MEDS MAY CAUSE NAUSEA, VOMITING, DIARRHEA, ABDOMINAL PAIN, AND RASH. DO NOT DRINK ALCOHOL OR USE COUGH SYRUP CONTAINING ALCOHOL WHILE TAKING ABX. SHE WILL NEED A H. PYLROI STOOL ANTIGEN IN 3 MOS.

## 2010-12-21 ENCOUNTER — Other Ambulatory Visit: Payer: Self-pay

## 2010-12-21 NOTE — Telephone Encounter (Signed)
Called Rx's to pharmacy at [facility] in [place]. LMOM for pt that they are called in. H. Pylori stool order on file for 03/22/2011.

## 2010-12-24 ENCOUNTER — Telehealth: Payer: Self-pay

## 2010-12-24 NOTE — Telephone Encounter (Signed)
Pt returned call. Said she could not afford the Flagyl, it  was $138.00. Said the pharmacist was going to fax over request to see what Dr. Darrick Penna would recommend.  Pt was informed she will need to do the H.Pylori Stool Test in March.  ( See telephone note of 12/24/2010 )

## 2010-12-24 NOTE — Telephone Encounter (Signed)
Called home. LM for pt to call, to confirm she got her medicine.

## 2010-12-24 NOTE — Telephone Encounter (Signed)
Pt said she cannot afford the Flagyl @ $138.00. Said pharmacist was supposed to fax over request for recommendations from Dr. Darrick Penna.

## 2010-12-25 MED ORDER — METRONIDAZOLE 500 MG PO TABS: ORAL_TABLET | ORAL | Status: AC

## 2010-12-25 NOTE — Telephone Encounter (Signed)
Called cell. Mailbox full. Called home-she's at work. Called [phone]. Amoxicillin cost $5. Flagyl cost $138.00. Will send Rx for generic. Pt to call if she has any problems. Explained she will need to save up her money if this Abx regimen does not clear the infection. Explained this is not the ideal regimen to treat H. Pylori.

## 2010-12-25 NOTE — Telephone Encounter (Signed)
Addended by: [place] on: 12/25/2010 07:50 AM   Modules accepted: Orders

## 2011-04-15 ENCOUNTER — Telehealth: Payer: Self-pay | Admitting: Gastroenterology

## 2011-04-15 ENCOUNTER — Ambulatory Visit: Payer: Self-pay | Admitting: Urgent Care

## 2011-04-15 ENCOUNTER — Ambulatory Visit: Payer: Self-pay | Admitting: Gastroenterology

## 2011-04-15 NOTE — Telephone Encounter (Signed)
Pt was a no show

## 2011-05-09 NOTE — Telephone Encounter (Signed)
May send letter for f/u.

## 2011-05-15 ENCOUNTER — Encounter: Payer: Self-pay | Admitting: Gastroenterology

## 2011-05-15 NOTE — Telephone Encounter (Signed)
Mailed letter to patient to call office to set up OV °

## 2013-07-25 IMAGING — NM NM HEPATO W/GB/PHARM/[PERSON_NAME]
2 series · 12 of 12 positions shown · non-contrast
Comparison: None.

CLINICAL DATA: Abdominal pain

NUCLEAR MEDICINE HEPATOBILIARY WITH GB, PHARM AND QUAN MEASURE
Radiopharmaceutical: 4.8 mCi technetium 99m mebrofenin.  1.8 mcg
CCK a slow infusion.

[gb hepatobiliary scan · 3.19mm/px · 6 of 30 frames shown (1 of 2)]
[frame 3/30]
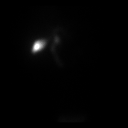
[frame 8/30]
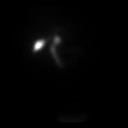
[frame 13/30]
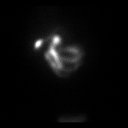
[frame 18/30]
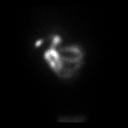
[frame 23/30]
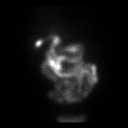
[frame 28/30]
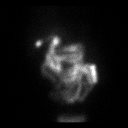

[gb hepatobiliary scan · 3.19mm/px · 6 of 60 frames shown (2 of 2)]
[frame 6/60]
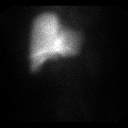
[frame 16/60]
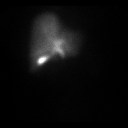
[frame 26/60]
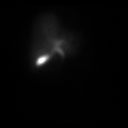
[frame 36/60]
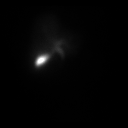
[frame 46/60]
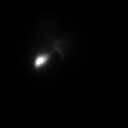
[frame 56/60]
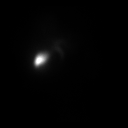

[12 of 12 positions shown; findings below may reference images not displayed]

FINDINGS: There is brisk uptake of radiotracer by the hepatic
parenchyma with rapid clearance of blood pool activity.  Biliary
activity is seen by 10 minutes after injection gallbladder filling
starts at 10 - 15 minutes.  Common duct activity is also seen at 10
- 15 minutes.  Gut activity is visible at 1 hour.

After slow infusion of intravenous CCK, gallbladder ejection
fraction is calculated 94%.
IMPRESSION: Normal hepatobiliary patency scan.

Normal gallbladder ejection fraction.

## 2019-02-08 ENCOUNTER — Encounter: Payer: Self-pay | Admitting: Gastroenterology

## 2019-02-08 ENCOUNTER — Ambulatory Visit: Payer: Self-pay | Admitting: Gastroenterology

## 2019-02-08 ENCOUNTER — Telehealth: Payer: Self-pay | Admitting: Gastroenterology

## 2019-02-08 NOTE — Telephone Encounter (Signed)
Noted  

## 2019-02-08 NOTE — Telephone Encounter (Signed)
PATIENT WAS A NO SHOW AND LETTER SENT  °

## 2019-03-29 ENCOUNTER — Encounter: Payer: Self-pay | Admitting: Gastroenterology

## 2019-03-29 NOTE — Progress Notes (Deleted)
Referring Provider: Raiford Simmonds., PA-C Primary Care Physician:  Raiford Simmonds., PA-C Primary GI Physician: Dr. Oneida Alar  No chief complaint on file.   HPI:   Wanda Heath is a 64 y.o. female presenting today with a history of colon polyps with tubulovillous adenoma in 2009, most recent colonoscopy in November 2012 for rectal bleeding with 1 sessile hyperplastic polyp, internal hemorrhoids, recommendations to repeat colonoscopy in 10 years. EGD also completed in November 2012 as well for dyspepsia with mild gastritis, positive for H. Pylori. She was last seen in our office prior to procedures. Per review of telephone notes, it is unclear whether patient completed treatment for H. Pylori. Due to cost, it appears she was to be treated with amoxicillin, flagyl, and omeprazole.    Today:  Past Medical History:  Diagnosis Date  . Diverticulosis of colon 8/09  . Hemorrhoids 8/09  . Tubulovillous adenoma of colon 08/28/07   Dr Oneida Alar colonoscopy    Past Surgical History:  Procedure Laterality Date  . APPENDECTOMY    . COLONOSCOPY  08/28/07   1.2 cm sessile ascending colon polyp,removed , 29mm flat sigmoid colon polyp removed, rare sigmoid diverticula, moderate internal hemorrhoids   . COLONOSCOPY  12/07/2010   Dr. Oneida Alar; 1 sessile hyperplastic polyp, internal hemorrhoids, tortuous colon.  Recommended repeat colonoscopy in 10 years.  . ESOPHAGOGASTRODUODENOSCOPY  12/07/2010   Mild gastritis. H. pylori positive.     Current Outpatient Medications  Medication Sig Dispense Refill  . metroNIDAZOLE (FLAGYL) 500 MG tablet 1 po bid for 10 days 20 tablet 0  . omeprazole (PRILOSEC) 20 MG capsule Take 1 capsule (20 mg total) by mouth daily. 31 capsule 2   No current facility-administered medications for this visit.    Allergies as of 03/31/2019  . (No Known Allergies)    Family History  Problem Relation Age of Onset  . Coronary artery disease Father     Social History    Socioeconomic History  . Marital status: Single    Spouse name: Not on file  . Number of children: 2  . Years of education: Not on file  . Highest education level: Not on file  Occupational History  . Occupation: unemployed, home health care  Tobacco Use  . Smoking status: Never Smoker  Substance and Sexual Activity  . Alcohol use: No  . Drug use: No  . Sexual activity: Not on file  Other Topics Concern  . Not on file  Social History Narrative   Lives w/ daughter   Social Determinants of Health   Financial Resource Strain:   . Difficulty of Paying Living Expenses:   Food Insecurity:   . Worried About Charity fundraiser in the Last Year:   . Arboriculturist in the Last Year:   Transportation Needs:   . Film/video editor (Medical):   Marland Kitchen Lack of Transportation (Non-Medical):   Physical Activity:   . Days of Exercise per Week:   . Minutes of Exercise per Session:   Stress:   . Feeling of Stress :   Social Connections:   . Frequency of Communication with Friends and Family:   . Frequency of Social Gatherings with Friends and Family:   . Attends Religious Services:   . Active Member of Clubs or Organizations:   . Attends Archivist Meetings:   Marland Kitchen Marital Status:     Review of Systems: Gen: Denies fever, chills, anorexia. Denies fatigue, weakness, weight loss.  CV: Denies chest pain, palpitations, syncope, peripheral edema, and claudication. Resp: Denies dyspnea at rest, cough, wheezing, coughing up blood, and pleurisy. GI: Denies vomiting blood, jaundice, and fecal incontinence.   Denies dysphagia or odynophagia. Derm: Denies rash, itching, dry skin Psych: Denies depression, anxiety, memory loss, confusion. No homicidal or suicidal ideation.  Heme: Denies bruising, bleeding, and enlarged lymph nodes.  Physical Exam: There were no vitals taken for this visit. General:   Alert and oriented. No distress noted. Pleasant and cooperative.  Head:   Normocephalic and atraumatic. Eyes:  Conjuctiva clear without scleral icterus. Mouth:  Oral mucosa pink and moist. Good dentition. No lesions. Heart:  S1, S2 present without murmurs appreciated. Lungs:  Clear to auscultation bilaterally. No wheezes, rales, or rhonchi. No distress.  Abdomen:  +BS, soft, non-tender and non-distended. No rebound or guarding. No HSM or masses noted. Msk:  Symmetrical without gross deformities. Normal posture. Extremities:  Without edema. Neurologic:  Alert and  oriented x4 Psych:  Alert and cooperative. Normal mood and affect.

## 2019-03-31 ENCOUNTER — Ambulatory Visit: Payer: Self-pay | Admitting: Gastroenterology

## 2019-04-18 ENCOUNTER — Encounter: Payer: Self-pay | Admitting: Gastroenterology

## 2019-04-18 NOTE — Progress Notes (Deleted)
Referring Provider:*** Primary Care Physician:  Raiford Simmonds., PA-C Primary Gastroenterologist:  Dr. Rayne Du chief complaint on file.   HPI:   Wanda Heath is a 64 y.o. female presenting today for ***   History of epigastric pain, rectal bleeding, internal hemorrhoids, and tubulovillous adenoma in 2009. Last colonoscopy in November 2012 with 1 hyperplastic polyp with recommendations to repeat in 10 years. Prior evaluation for epigastric pain with CT A/P in 2012 with possible adenomyomatosis of the gallbladder or gallbladder sludge.  Follow-up HIDA scan within normal limits.  EGD in November 2012 with mild gastritis and positive for H. pylori.  She was prescribed amoxicillin, Biaxin, and omeprazole for H. pylori.  Patient was unable to afford Biaxin so metronidazole was sent to pharmacy.  Recommended H. pylori stool antigen in 3 months but this was not completed. Last seen by our staff at the time of her EGD/TCS in 2012.   Today:     Past Medical History:  Diagnosis Date  . Diverticulosis of colon 8/09  . Hemorrhoids 8/09  . Tubulovillous adenoma of colon 08/28/07   Dr Oneida Alar colonoscopy    Past Surgical History:  Procedure Laterality Date  . APPENDECTOMY    . COLONOSCOPY  08/28/07   1.2 cm sessile ascending colon polyp,removed , 32mm flat sigmoid colon polyp removed, rare sigmoid diverticula, moderate internal hemorrhoids   . COLONOSCOPY  12/07/2010   Dr. Oneida Alar; 1 sessile hyperplastic polyp, internal hemorrhoids, tortuous colon.  Recommended repeat colonoscopy in 10 years.  . ESOPHAGOGASTRODUODENOSCOPY  12/07/2010   Mild gastritis. H. pylori positive.     Current Outpatient Medications  Medication Sig Dispense Refill  . metroNIDAZOLE (FLAGYL) 500 MG tablet 1 po bid for 10 days 20 tablet 0  . omeprazole (PRILOSEC) 20 MG capsule Take 1 capsule (20 mg total) by mouth daily. 31 capsule 2   No current facility-administered medications for this visit.    Allergies  as of 04/19/2019  . (No Known Allergies)    Family History  Problem Relation Age of Onset  . Coronary artery disease Father     Social History   Socioeconomic History  . Marital status: Single    Spouse name: Not on file  . Number of children: 2  . Years of education: Not on file  . Highest education level: Not on file  Occupational History  . Occupation: unemployed, home health care  Tobacco Use  . Smoking status: Never Smoker  Substance and Sexual Activity  . Alcohol use: No  . Drug use: No  . Sexual activity: Not on file  Other Topics Concern  . Not on file  Social History Narrative   Lives w/ daughter   Social Determinants of Health   Financial Resource Strain:   . Difficulty of Paying Living Expenses:   Food Insecurity:   . Worried About Charity fundraiser in the Last Year:   . Arboriculturist in the Last Year:   Transportation Needs:   . Film/video editor (Medical):   Marland Kitchen Lack of Transportation (Non-Medical):   Physical Activity:   . Days of Exercise per Week:   . Minutes of Exercise per Session:   Stress:   . Feeling of Stress :   Social Connections:   . Frequency of Communication with Friends and Family:   . Frequency of Social Gatherings with Friends and Family:   . Attends Religious Services:   . Active Member of Clubs or Organizations:   .  Attends Archivist Meetings:   Marland Kitchen Marital Status:   Intimate Partner Violence:   . Fear of Current or Ex-Partner:   . Emotionally Abused:   Marland Kitchen Physically Abused:   . Sexually Abused:     Review of Systems: Gen: Denies any fever, chills, fatigue, weight loss, lack of appetite.  CV: Denies chest pain, heart palpitations, peripheral edema, syncope.  Resp: Denies shortness of breath at rest or with exertion. Denies wheezing or cough.  GI: Denies dysphagia or odynophagia. Denies jaundice, hematemesis, fecal incontinence. GU : Denies urinary burning, urinary frequency, urinary hesitancy MS: Denies  joint pain, muscle weakness, cramps, or limitation of movement.  Derm: Denies rash, itching, dry skin Psych: Denies depression, anxiety, memory loss, and confusion Heme: Denies bruising, bleeding, and enlarged lymph nodes.  Physical Exam: There were no vitals taken for this visit. General:   Alert and oriented. Pleasant and cooperative. Well-nourished and well-developed.  Head:  Normocephalic and atraumatic. Eyes:  Without icterus, sclera clear and conjunctiva pink.  Ears:  Normal auditory acuity. Nose:  No deformity, discharge,  or lesions. Mouth:  No deformity or lesions, oral mucosa pink.  Neck:  Supple, without mass or thyromegaly. Lungs:  Clear to auscultation bilaterally. No wheezes, rales, or rhonchi. No distress.  Heart:  S1, S2 present without murmurs appreciated.  Abdomen:  +BS, soft, non-tender and non-distended. No HSM noted. No guarding or rebound. No masses appreciated.  Rectal:  Deferred  Msk:  Symmetrical without gross deformities. Normal posture. Pulses:  Normal pulses noted. Extremities:  Without clubbing or edema. Neurologic:  Alert and  oriented x4;  grossly normal neurologically. Skin:  Intact without significant lesions or rashes. Cervical Nodes:  No significant cervical adenopathy. Psych:  Alert and cooperative. Normal mood and affect.

## 2019-04-19 ENCOUNTER — Ambulatory Visit: Payer: Self-pay | Admitting: Gastroenterology

## 2020-11-07 ENCOUNTER — Encounter: Payer: Self-pay | Admitting: *Deleted

## 2021-08-20 ENCOUNTER — Other Ambulatory Visit: Payer: Self-pay | Admitting: *Deleted

## 2021-08-20 NOTE — Patient Outreach (Signed)
  Care Coordination   08/20/2021 Name: Wanda Heath MRN: [id number] DOB: 09-Nov-1955   Care Coordination Outreach Attempts:  An unsuccessful telephone outreach was attempted today to offer the patient information about available care coordination services as a benefit of their health plan.   Follow Up Plan:  Additional outreach attempts will be made to offer the patient care coordination information and services.   Encounter Outcome:  No Answer  Care Coordination Interventions Activated:  No   Care Coordination Interventions:  No, not indicated    Valente David, RN, MSN, [facility] Care Coordinator [phone]

## 2021-08-24 ENCOUNTER — Other Ambulatory Visit: Payer: Self-pay | Admitting: *Deleted

## 2021-08-24 NOTE — Patient Outreach (Signed)
  Care Coordination   08/24/2021 Name: [name] MRN: [id number] DOB: Mar 26, 1955   Care Coordination Outreach Attempts:  A second unsuccessful outreach was attempted today to offer the patient with information about available care coordination services as a benefit of their health plan.     Follow Up Plan:  Additional outreach attempts will be made to offer the patient care coordination information and services.   Encounter Outcome:  No Answer  Care Coordination Interventions Activated:  No   Care Coordination Interventions:  No, not indicated    Valente David, RN, MSN, [facility] Care Coordinator [phone]

## 2021-08-30 ENCOUNTER — Other Ambulatory Visit: Payer: Self-pay | Admitting: *Deleted

## 2021-08-30 NOTE — Patient Outreach (Signed)
  Care Coordination   08/30/2021 Name: Wanda Heath MRN: [id number] DOB: 09-17-1955   Care Coordination Outreach Attempts:  A third unsuccessful outreach was attempted today to offer the patient with information about available care coordination services as a benefit of their health plan.   Follow Up Plan:  No further outreach attempts will be made at this time. We have been unable to contact the patient to offer or enroll patient in care coordination services  Encounter Outcome:  No Answer  Care Coordination Interventions Activated:  No   Care Coordination Interventions:  No, not indicated    Valente David, RN, MSN, [facility] Care Management Care Management Coordinator [phone]

## 2022-12-23 DIAGNOSIS — I10 Essential (primary) hypertension: Secondary | ICD-10-CM | POA: Diagnosis not present

## 2022-12-23 DIAGNOSIS — K529 Noninfective gastroenteritis and colitis, unspecified: Secondary | ICD-10-CM | POA: Diagnosis not present

## 2022-12-23 DIAGNOSIS — E1165 Type 2 diabetes mellitus with hyperglycemia: Secondary | ICD-10-CM | POA: Diagnosis not present

## 2022-12-23 DIAGNOSIS — Z20822 Contact with and (suspected) exposure to covid-19: Secondary | ICD-10-CM | POA: Diagnosis not present

## 2022-12-23 DIAGNOSIS — D259 Leiomyoma of uterus, unspecified: Secondary | ICD-10-CM | POA: Diagnosis not present

## 2022-12-23 DIAGNOSIS — Z7984 Long term (current) use of oral hypoglycemic drugs: Secondary | ICD-10-CM | POA: Diagnosis not present

## 2022-12-23 DIAGNOSIS — R1032 Left lower quadrant pain: Secondary | ICD-10-CM | POA: Diagnosis not present

## 2023-11-24 NOTE — Telephone Encounter (Signed)
 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  Please schedule follow-up with [facility] in 3 weeks. Patient does not need xrays. Please schedule follow-up with [facility] in 6 weeks. Patient will need A/P, lateral of left hip.   Thank you,  Marinell Grain, MD

## 2023-11-25 NOTE — Progress Notes (Addendum)
 Case Management Adult Assessment  CSN: [id number] DOB: March 26, 1955 Service: Gerontology Location: S260/A   Info & Contacts Assessment Completed: In-person interview with Patient, Medical Record reviewed The patient's status at this time is:: Able to communicate Prior to admission, patient resided at: Private residence Prior to admission, patient lived with : Other (Comment) (family) The patient's decision maker is:: Patient At discharge, will patient return to prior residence: No Barriers to education: Physical   No emergency contact information on file.  Assessment Is this patient at baseline?: No Was patient independent with ADLs prior to admission?: Yes Was patient independent with mobility prior to admission?: Yes Does this patient have or need any DME?: No Does this patient have or need any home health services?: No Does this patient have or need any personal care services?: No   Social Does patient have a mental health diagnosis?: No Does patient have an acute substance use issue?: No   Discharge How will patient obtain prescription meds at discharge:: Other (comment) (Patient unsure if she has prescription coverage.) Type of Payer Source:: Medicare How will this patient obtain follow-up care after discharge?: Facility MD How will this patient reach the discharge destination?: Other (Comment) (Wheelchair transport.) Is this a Chronic Dialysis patient?: No Patient Discharge Goals of Care: Increased Mobility/Self Care, Return to Work/Prior Level of Function  Discharge Education Discharge Plan Discussed With:: Patient Education Readiness:: Acceptance Education Method:: Explanation Education Response:: Verbalizes Understanding     CM spoke with patient at bedside for discharge planning. HIPAA verified. CM introduced herself and explained role.  Case manager confirmed demographics/facesheet information including correct address and phone number on file  [phone].  Prescriptions/barriers: Patient has a MA plan but states unsure about Part D coverage.  Family support system: Lives with family. Family can assist with needs post d/c.  Substance use: patient denies Mental health diagnosis: patient denies  ADLs/IADLs: independent prior to admission  Living situation/home environment: Lives with family in a one level apartment. Steps into home: none Steps in home: none  Transportation at discharge: undetermined  DME: none  Home health: none  Discussed MD/therapy recommend IPR at discharge after evaluation.  Patient given list of SNFs  (20 miles) and IPRs (30 miles) from [employer] Patient has Patient Choice Form. Patient prefers SNF/IPR closer to home due to the holidays and being closer to family. Preferences include [facility], [facility], and [facility] and [facility].  PCP: none  CM awaiting bed offers and patient's choice to send [facility] prior auth request.  Update 1316: Patient states first preference is [place]. Selected in the system. CM requesting insurance auth. CM has attempted [facility] to confirm bed availability for 11/26/2023.  Update 1319: Insurance auth sent to [employer] [id number].  Russell SAUNDERS [phone]                 Post Acute Placement Status: Review currently in progress.  Case Management Coordination Status: Coordination In-Progress    Anticipated Discharge Location: Inpatient Rehabilitation Facility  If Plan A discharging location is not feasible: Potential Plan B: Skilled Nursing Facility - short-term rehab         Russell Freund, RN

## 2023-11-25 NOTE — Unmapped External Note (Signed)
       [place] Medicaid Long Term Care [facility] Form   Prior Approval   IDENTIFICATION  Patient Name: Shariyah Eland Birthdate: [birth year]  SSN:  [id number]  Sex: female Admission Date (Current Location): 11/23/2023  [facility] and [place] Number:    Facility and Address:  [facility] [address] [address] [place] [address]  Provider Number: [id number]  Attending Physician Name:   Gardner Nels Rile, MD Address:  [facility] [address] [place] [address] Relative Name and Address:  No emergency contact information on file.  Current Level of Care: Hospital Recommended Level of Care: Skilled Nursing Facility Prior Approval Number:    Date Approved/Denied:   PASRR Number: [id number]  Discharge Plan: Skilled Nursing Facility    Current Diagnoses: Principal Problem:   Closed fracture of left femur, initial encounter Active Problems:   Left displaced femoral neck fracture    (CMD) Resolved Problems:   * No resolved hospital problems. *    DISORIENTED AMBULATORY STATUS BLADDER BOWEL   (Alert and oriented.) Semi-Ambulatory (Uses rolling walker.) External Catheter Continent  INAPPROPRIATE BEHAVIOR FUNCTIONAL LIMITATIONS COMMUNICATION OF NEEDS RESPIRATION    Sight Verbally Normal  PERSONAL CARE ASSISTANCE ACTIVITIES/SOCIAL SKIN NUTRITION STATUS  Bathing, Dressing Active, Family Supportive Other (comment) (Wound 11/24/23 Incision Thigh Anterior;Left) Diet, Weight, Height (Diet: Adult Diet- Regular: General starting at 11/17 1828) Height: 1.575 m (5' 2) (11/24/2023  1:45 PM) Weight: 71.5 kg (157 lb 9 oz) (11/25/2023  5:47 AM)  PHYSICIAN VISITS NEUROLOGICAL    30 days          SPECIAL CARE FACTORS FREQUENCY  PT (By Licensed PT), Occupational Therapy     PT Frequency: 5-6x week           OT Frequency: 3x week    MEDICATIONS: Scheduled Meds:acetaminophen, 1,000 mg, oral, Q6H SCH enoxaparin,  40 mg, subcutaneous, At Bedtime ergocalciferol, 50,000 Units, oral, Weekly hydroCHLOROthiazide, 12.5 mg, oral, Daily insulin aspart (NovoLOG)/insulin lispro (HumaLOG) injection (WF), 0-8 Units, subcutaneous, TID PC lisinopriL, 10 mg, oral, Daily polyethylene glycol, 17 g, oral, Daily senna, 2 tablet, oral, At Bedtime   Continuous Infusions:  PRN Meds:..  dextrose .  dextrose .  naloxone .  oxyCODONE **OR** oxyCODONE Do not use this list as official medication orders. Please verify with discharge summary.  X-ray and Laboratory Findings/Date: See Clinical Information for details     ADDITIONAL INFORMATION:      Russell Freund, RN

## 2023-12-31 ENCOUNTER — Encounter (INDEPENDENT_AMBULATORY_CARE_PROVIDER_SITE_OTHER): Payer: Self-pay | Admitting: *Deleted
